# Patient Record
Sex: Male | Born: 1975 | Race: Black or African American | Hispanic: No | Marital: Single | Smoking: Never smoker
Health system: Southern US, Community
[De-identification: ages and names within clinical notes are randomized; demographics above are authoritative.]

## PROBLEM LIST (undated history)

## (undated) HISTORY — PX: OTHER SURGICAL HISTORY: SHX169

---

## 2007-01-31 ENCOUNTER — Emergency Department (HOSPITAL_COMMUNITY): Admission: EM | Admit: 2007-01-31 | Discharge: 2007-01-31 | Payer: Self-pay | Admitting: Emergency Medicine

## 2008-09-16 ENCOUNTER — Encounter: Admission: RE | Admit: 2008-09-16 | Discharge: 2008-09-16 | Payer: Self-pay | Admitting: Occupational Medicine

## 2011-01-25 IMAGING — CR DG CHEST 1V
1 series · 1 of 1 positions shown · non-contrast
Comparison: January 31, 2007

CLINICAL DATA: Pre employment respiratory exam

CHEST - 1 VIEW

[view not recorded]
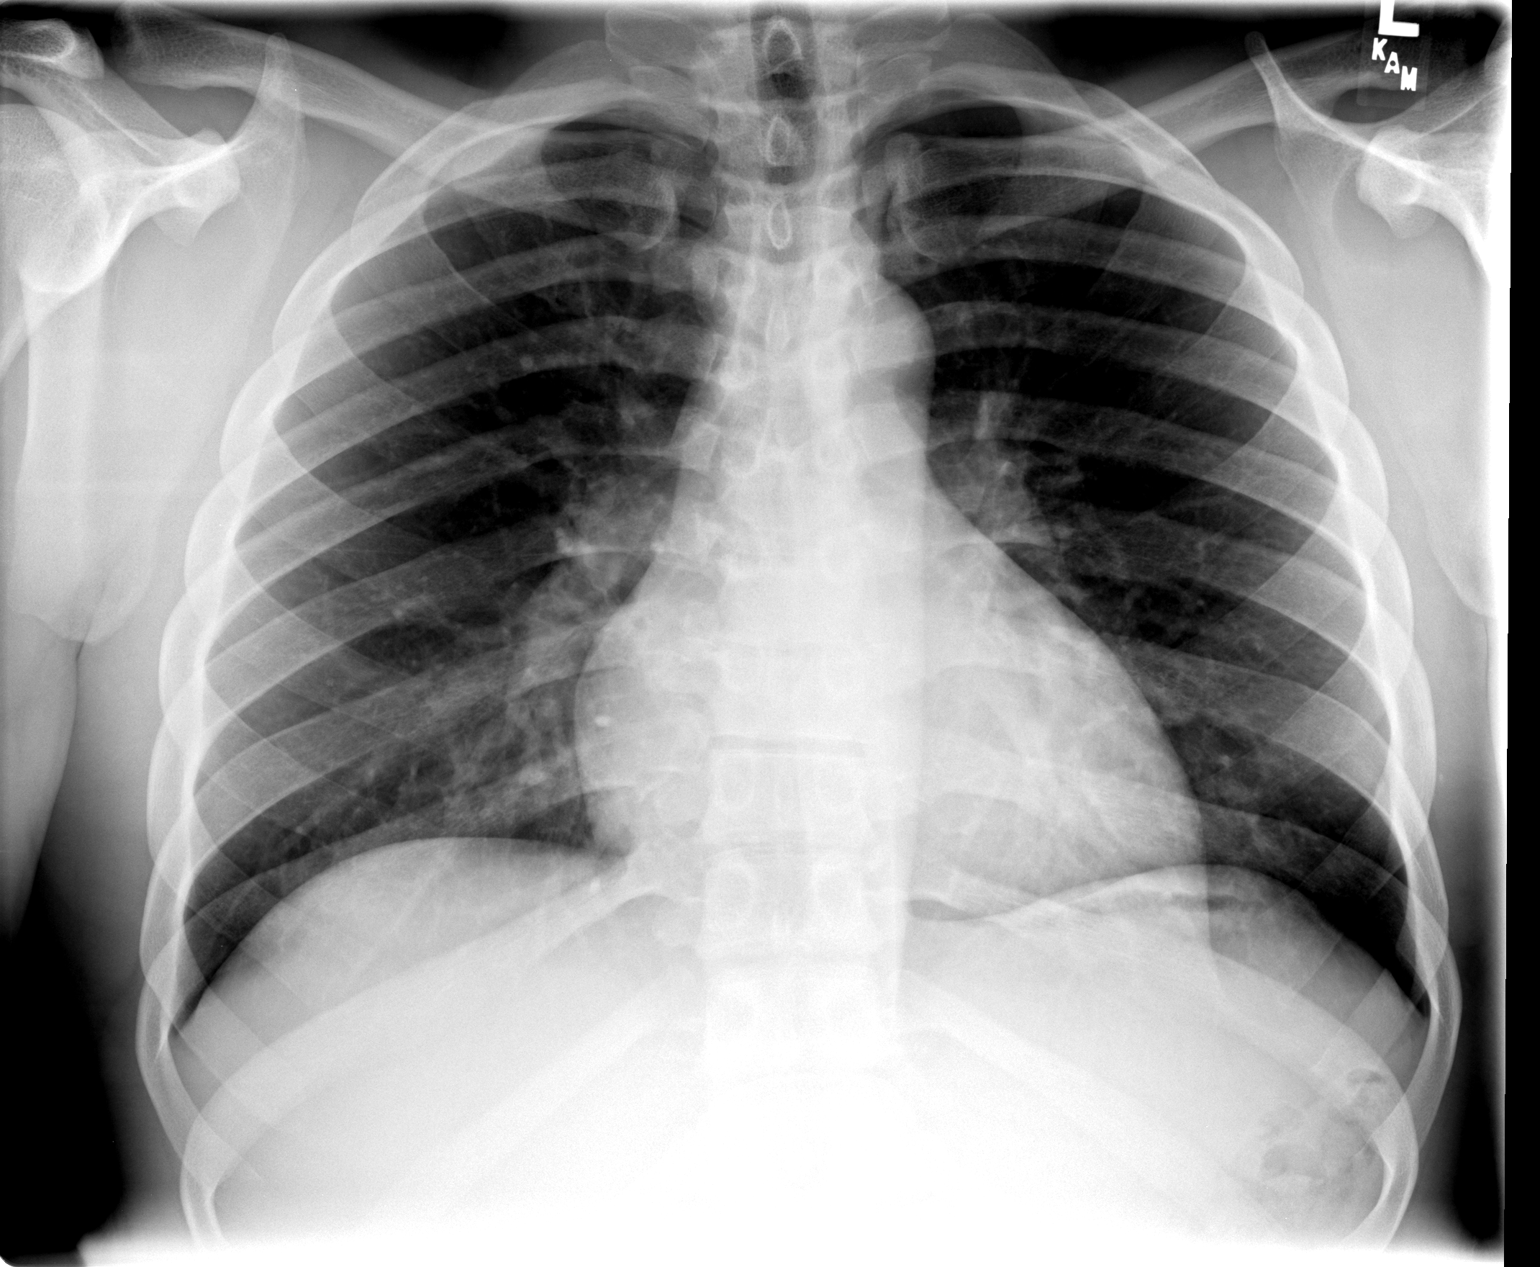

[1 of 1 positions shown; findings below may reference images not displayed]

FINDINGS: The cardiac silhouette, mediastinum, pulmonary
vasculature are within normal limits.  Both lungs are clear.  The
osseous structures are unremarkable.
IMPRESSION: Stable frontal chest x-ray, negative.

## 2011-05-10 DIAGNOSIS — B20 Human immunodeficiency virus [HIV] disease: Secondary | ICD-10-CM | POA: Insufficient documentation

## 2011-05-10 DIAGNOSIS — Z21 Asymptomatic human immunodeficiency virus [HIV] infection status: Secondary | ICD-10-CM

## 2011-05-10 HISTORY — DX: Asymptomatic human immunodeficiency virus (hiv) infection status: Z21

## 2014-11-20 DIAGNOSIS — IMO0001 Reserved for inherently not codable concepts without codable children: Secondary | ICD-10-CM | POA: Insufficient documentation

## 2015-11-30 DIAGNOSIS — Z113 Encounter for screening for infections with a predominantly sexual mode of transmission: Secondary | ICD-10-CM | POA: Insufficient documentation

## 2015-11-30 DIAGNOSIS — Z8619 Personal history of other infectious and parasitic diseases: Secondary | ICD-10-CM | POA: Insufficient documentation

## 2015-11-30 DIAGNOSIS — Z0000 Encounter for general adult medical examination without abnormal findings: Secondary | ICD-10-CM | POA: Insufficient documentation

## 2016-05-02 DIAGNOSIS — K219 Gastro-esophageal reflux disease without esophagitis: Secondary | ICD-10-CM | POA: Insufficient documentation

## 2016-05-02 DIAGNOSIS — Z79899 Other long term (current) drug therapy: Secondary | ICD-10-CM | POA: Insufficient documentation

## 2016-05-17 DIAGNOSIS — R030 Elevated blood-pressure reading, without diagnosis of hypertension: Secondary | ICD-10-CM | POA: Insufficient documentation

## 2016-08-08 ENCOUNTER — Ambulatory Visit (INDEPENDENT_AMBULATORY_CARE_PROVIDER_SITE_OTHER): Payer: Self-pay | Admitting: Podiatry

## 2016-08-08 ENCOUNTER — Ambulatory Visit (INDEPENDENT_AMBULATORY_CARE_PROVIDER_SITE_OTHER): Payer: Self-pay

## 2016-08-08 VITALS — BP 140/82

## 2016-08-08 DIAGNOSIS — M779 Enthesopathy, unspecified: Secondary | ICD-10-CM

## 2016-08-08 DIAGNOSIS — B351 Tinea unguium: Secondary | ICD-10-CM

## 2016-08-08 DIAGNOSIS — M7989 Other specified soft tissue disorders: Secondary | ICD-10-CM

## 2016-08-08 MED ORDER — TRIAMCINOLONE ACETONIDE 10 MG/ML IJ SUSP
10.0000 mg | Freq: Once | INTRAMUSCULAR | Status: AC
  Administered 2016-08-08: 10 mg

## 2016-08-08 MED ORDER — DICLOFENAC SODIUM 75 MG PO TBEC: 75.0000 mg | DELAYED_RELEASE_TABLET | Freq: Two times a day (BID) | ORAL | 2 refills | Status: DC

## 2016-08-08 NOTE — Progress Notes (Signed)
Subjective:    Patient ID: Timothy BandaGregory Holden, male   DOB: 41 y.o.   MRN: [id number]   HPI patient presents stating he's had a lot of pain on the inside of his right ankle and some on the outside and also has significant nail disease of all nails with thickness and discoloration along with skin    Review of Systems  All other systems reviewed and are negative.       Objective:  Physical Exam  Constitutional: He is oriented to person, place, and time.  Cardiovascular: Intact distal pulses.   Musculoskeletal: Normal range of motion.  Neurological: He is alert and oriented to person, place, and time.  Skin: Skin is warm.  Nursing note and vitals reviewed.  neurovascular status found to be intact muscle strength was adequate range of motion within normal limits with no indication of posterior tibial muscle dysfunction quite a bit of discomfort as it comes under the medial malleolus and slightly proximal. Lateral pain is also present but it appears to be more compensation with no other pathology noted at this time     Assessment:    Most likely posterior tibial tendinitis right with compensatory lateral pain     Plan:    H&P condition and arch explained to patient. At this point I did a careful sheath injection right 3 mg Kenalog 5 mill grams Xylocaine and I applied a fascial brace to lift the arch up and then placed on diclofenac 75 mg twice a day and will see back again in 2 weeks. I am sending him for liver function studies to make sure there is no issues before we would consider antifungal therapy  X-rays indicate mild depression of the arch but no indication of complete collapse with mild arthritis noted

## 2016-08-08 NOTE — Progress Notes (Signed)
   Subjective:    Patient ID: Timothy Holden, male    DOB: 01/22/1976, 41 y.o.   MRN: [id number]  HPI    Review of Systems  Constitutional: Positive for activity change.  Musculoskeletal: Positive for arthralgias and myalgias.  All other systems reviewed and are negative.      Objective:   Physical Exam        Assessment & Plan:

## 2016-08-16 ENCOUNTER — Ambulatory Visit: Payer: Self-pay | Admitting: Podiatry

## 2016-08-22 ENCOUNTER — Ambulatory Visit: Payer: Self-pay | Admitting: Podiatry

## 2016-08-28 ENCOUNTER — Telehealth: Payer: Self-pay | Admitting: Podiatry

## 2016-08-28 NOTE — Telephone Encounter (Signed)
I informed pt, I had reviewed his clinicals and his appt was not based on the lab Dr. Charlsie Merlesegal had ordered, but to get them performed as soon as he could and we could always call him with the results. Pt states understanding.

## 2016-08-28 NOTE — Telephone Encounter (Signed)
Patient is scheduled for a follow up appointment with Dr. Charlsie Merlesegal tomorrow morning. He was supposed to have blood work taken before the appointment but due to conflicts has not been able to go. Wants to know if he went today would it be too early for him to keep the appointment for tomorrow morning or could we still see him. Requested a call back.

## 2016-08-29 ENCOUNTER — Ambulatory Visit (INDEPENDENT_AMBULATORY_CARE_PROVIDER_SITE_OTHER): Payer: Self-pay | Admitting: Podiatry

## 2016-08-29 ENCOUNTER — Telehealth: Payer: Self-pay | Admitting: *Deleted

## 2016-08-29 ENCOUNTER — Encounter: Payer: Self-pay | Admitting: Podiatry

## 2016-08-29 DIAGNOSIS — M779 Enthesopathy, unspecified: Secondary | ICD-10-CM

## 2016-08-29 MED ORDER — TRIAMCINOLONE ACETONIDE 10 MG/ML IJ SUSP
10.0000 mg | Freq: Once | INTRAMUSCULAR | Status: AC
  Administered 2016-08-29: 10 mg

## 2016-08-29 NOTE — Progress Notes (Signed)
Subjective:    Patient ID: Margit BandaGregory Frett, male   DOB: 41 y.o.   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 Also scanned for custom orthotics

## 2016-08-29 NOTE — Telephone Encounter (Signed)
Pt states was seen in office today and had an injection in the outside of his right ankle, went to the gym and was doing stretches on the leg press, now ankle is painful and extremely swollen, what ankle brace does Dr. Charlsie Merlesegal want him to use. Dr. Charlsie Merlesegal states have pt use the brace given and to rest, ice at least 3-4 times a day for 15-20 minutes each session protecting skin from the ice pack with cloth, and if continued problem make an appt for next week. I informed pt and he states understanding.

## 2016-09-20 ENCOUNTER — Ambulatory Visit: Payer: Self-pay | Admitting: Podiatry

## 2016-09-26 ENCOUNTER — Telehealth: Payer: Self-pay | Admitting: *Deleted

## 2016-09-26 NOTE — Telephone Encounter (Signed)
Pt states Dr.Regal was going to start on a medication to treat pt's fungal infection and he was going to order labs, pt states he is at his PCP and she will draw the labs if I would tell her what medication Dr. Charlsie Merlesegal wanted ordered. I told her over the speaker phone that Dr. Charlsie Merlesegal would either order terbinafine or itraconazole. Pt's doctor states she will start pt on the terbinafine since the itraconazole will interfere with a medication pt is to take.

## 2016-10-25 ENCOUNTER — Ambulatory Visit (INDEPENDENT_AMBULATORY_CARE_PROVIDER_SITE_OTHER): Payer: Self-pay | Admitting: Orthotics

## 2016-10-25 DIAGNOSIS — M779 Enthesopathy, unspecified: Secondary | ICD-10-CM

## 2016-10-25 NOTE — Progress Notes (Signed)
Patient came in today to pick up custom made foot orthotics.  The goals were accomplished and the patient reported no dissatisfaction with said orthotics.  Patient was advised of breakin period and how to report any issues. 

## 2018-07-08 ENCOUNTER — Emergency Department (HOSPITAL_COMMUNITY)
Admission: EM | Admit: 2018-07-08 | Discharge: 2018-07-08 | Disposition: A | Discharge status: Home / Self Care | Payer: Self-pay | Attending: Emergency Medicine | Admitting: Emergency Medicine

## 2018-07-08 ENCOUNTER — Other Ambulatory Visit: Payer: Self-pay

## 2018-07-08 DIAGNOSIS — B20 Human immunodeficiency virus [HIV] disease: Secondary | ICD-10-CM | POA: Insufficient documentation

## 2018-07-08 DIAGNOSIS — M62838 Other muscle spasm: Secondary | ICD-10-CM | POA: Insufficient documentation

## 2018-07-08 DIAGNOSIS — Z79899 Other long term (current) drug therapy: Secondary | ICD-10-CM | POA: Insufficient documentation

## 2018-07-08 DIAGNOSIS — M542 Cervicalgia: Secondary | ICD-10-CM | POA: Insufficient documentation

## 2018-07-08 MED ORDER — KETOROLAC TROMETHAMINE 30 MG/ML IJ SOLN
30.0000 mg | Freq: Once | INTRAMUSCULAR | Status: AC
  Administered 2018-07-08: 07:00:00 30 mg via INTRAMUSCULAR
  Filled 2018-07-08: qty 1

## 2018-07-08 MED ORDER — DIAZEPAM 5 MG PO TABS
5.0000 mg | ORAL_TABLET | Freq: Once | ORAL | Status: AC
  Administered 2018-07-08: 07:00:00 5 mg via ORAL
  Filled 2018-07-08: qty 1

## 2018-07-08 MED ORDER — DIAZEPAM 5 MG PO TABS: 5.0000 mg | ORAL_TABLET | Freq: Two times a day (BID) | ORAL | 0 refills | Status: DC | PRN

## 2018-07-08 MED ORDER — IBUPROFEN 600 MG PO TABS: 600.0000 mg | ORAL_TABLET | Freq: Four times a day (QID) | ORAL | 0 refills | Status: DC | PRN

## 2018-07-08 NOTE — ED Triage Notes (Signed)
Pt began having progressively worsening neck pain since last Monday.  Pt states the pain is 10 of 10 today.  No neuro deficits.Pt states he has tried ice, norco, IBU, (?muscle relaxer) and tylenol.

## 2018-07-08 NOTE — Discharge Instructions (Signed)
You were seen today for neck and upper back pain.  This is likely related to muscle spasm.  Take medications as prescribed.  Do not drive or operate heavy machinery while taking Valium.  Continue heat or ice.

## 2018-07-08 NOTE — ED Provider Notes (Signed)
[facility] EMERGENCY DEPARTMENT Provider Note   CSN: [id number] Arrival date & time: 07/08/18  9449    History   Chief Complaint Chief Complaint  Patient presents with  . Neck Pain    HPI Timothy Holden is a 43 y.o. male.     HPI  This is a 43 year old male with a history of reflux, HIV who presents with neck pain.  Patient reports 1 week history of right-sided neck pain.  He describes as sharp and radiating downwards into his scapula.  He states that he woke up Monday morning with the pain.  It is worse with range of motion and movement.  He has tried multiple over-the-counter medications including ibuprofen, muscle relaxant, Tylenol with minimal relief.  He rates his pain at 9 out of 10.  He denies any injury.  He denies weakness, numbness, tingling of the arm.  He denies any sore throat or fevers.  No past medical history on file.  Patient Active Problem List   Diagnosis Date Noted  . Elevated BP without diagnosis of hypertension 05/17/2016  . Encounter for long-term (current) use of medications 05/02/2016  . Gastroesophageal reflux disease without esophagitis 05/02/2016  . H/O syphilis 11/30/2015  . Screen for STD (sexually transmitted disease) 11/30/2015  . Class 3 obesity in adult 11/20/2014  . HIV (human immunodeficiency virus infection) (HCC) 05/10/2011    No past surgical history on file.      Home Medications    Prior to Admission medications   Medication Sig Start Date End Date Taking? Authorizing Provider  diazepam (VALIUM) 5 MG tablet Take 1 tablet (5 mg total) by mouth every 12 (twelve) hours as needed for muscle spasms. 07/08/18   Wylan Gentzler, Mayer Masker, MD  diclofenac (VOLTAREN) 75 MG EC tablet Take 1 tablet (75 mg total) by mouth 2 (two) times daily. 08/08/16   Lenn Sink, DPM  elvitegravir-cobicistat-emtricitabine-tenofovir (GENVOYA) 150-150-200-10 MG TABS tablet Take by mouth. 05/02/16   [provider]  ibuprofen (ADVIL)  600 MG tablet Take 1 tablet (600 mg total) by mouth every 6 (six) hours as needed. 07/08/18   Velvia Mehrer, Mayer Masker, MD  omeprazole (PRILOSEC) 40 MG capsule TK ONE C PO QAM 07/04/16   [provider]    Family History No family history on file.  Social History Social History   Tobacco Use  . Smoking status: Never Smoker  . Smokeless tobacco: Never Used  Substance Use Topics  . Alcohol use: Not on file  . Drug use: Not on file     Allergies   Other   Review of Systems Review of Systems  Constitutional: Negative for fever.  HENT: Negative for sore throat.   Respiratory: Negative for shortness of breath.   Cardiovascular: Negative for chest pain.  Musculoskeletal: Positive for neck pain.  Neurological: Negative for weakness and numbness.  All other systems reviewed and are negative.    Physical Exam Updated Vital Signs BP 137/89 (BP Location: Left Arm)   Pulse 73   Temp 98.9 F (37.2 C) (Oral)   Resp 18   Ht 1.778 m (5\' 10" )   Wt 136.1 kg   SpO2 96%   BMI 43.05 kg/m   Physical Exam Vitals signs and nursing note reviewed.  Constitutional:      General: He is not in acute distress.    Appearance: He is well-developed. He is not ill-appearing.  HENT:     Head: Normocephalic and atraumatic.  Neck:  Musculoskeletal: Normal range of motion.     Comments: Normal range of motion, no noted torticollis, no midline tenderness to palpation, step-off, deformity, tenderness to palpation over the right upper trapezius, muscle spasm noted, no overlying skin changes Cardiovascular:     Rate and Rhythm: Normal rate and regular rhythm.  Pulmonary:     Effort: Pulmonary effort is normal. No respiratory distress.  Musculoskeletal:     Comments: Tenderness palpation over the right superior trap, no overlying skin changes, asymmetric swelling, normal range of motion bilateral upper extremities  Skin:    General: Skin is warm and dry.  Neurological:     Mental Status:  He is alert and oriented to person, place, and time.     Comments: 5 out of 5 grip strength, biceps, triceps, and deltoid strength bilaterally  Psychiatric:        Mood and Affect: Mood normal.      ED Treatments / Results  Labs (all labs ordered are listed, but only abnormal results are displayed) Labs Reviewed - No data to display  EKG None  Radiology No results found.  Procedures Procedures (including critical care time)  Medications Ordered in ED Medications  diazepam (VALIUM) tablet 5 mg (5 mg Oral Given 07/08/18 0702)  ketorolac (TORADOL) 30 MG/ML injection 30 mg (30 mg Intramuscular Given 07/08/18 0702)     Initial Impression / Assessment and Plan / ED Course  I have reviewed the triage vital signs and the nursing notes.  Pertinent labs & imaging results that were available during my care of the patient were reviewed by me and considered in my medical decision making (see chart for details).        Presents with pain in the right upper trapezius.  Muscle spasm noted on exam.  No overlying skin changes or fluctuance.  Suspect musculoskeletal etiology.  Patient given Valium and Toradol.  No weakness or radicular symptoms.  Final Clinical Impressions(s) / ED Diagnoses   Final diagnoses:  Muscle spasm  Neck pain    ED Discharge Orders         Ordered    diazepam (VALIUM) 5 MG tablet  Every 12 hours PRN     07/08/18 0719    ibuprofen (ADVIL) 600 MG tablet  Every 6 hours PRN     07/08/18 0719           Shon BatonHorton, Melisha Eggleton F, MD 07/08/18 [phone]

## 2018-07-08 NOTE — ED Notes (Signed)
Patient verbalizes understanding of discharge instructions. Opportunity for questioning and answers were provided. Armband removed by staff, pt discharged from ED.  

## 2018-07-08 NOTE — ED Provider Notes (Signed)
Blood pressure 137/89, pulse 73, temperature 98.9 F (37.2 C), temperature source Oral, resp. rate 18, height 5\' 10"  (1.778 m), weight 136.1 kg, SpO2 96 %.  Assuming care from Dr. Wilkie Aye.  In short, Timothy Holden is a 43 y.o. male with a chief complaint of Neck Pain .  Refer to the original H&P for additional details.  08:00 AM  Patient feeling better after Toradol and Valium.  He is calling for a ride home.  Discussed side effects of Valium including drowsiness and that he should not drive a vehicle. Patient ready for discharge.     Maia Plan, MD 07/08/18 [id number]

## 2018-09-01 ENCOUNTER — Ambulatory Visit: Payer: Self-pay | Admitting: Podiatry

## 2018-09-04 ENCOUNTER — Ambulatory Visit: Payer: Self-pay | Admitting: Podiatry

## 2018-09-08 ENCOUNTER — Encounter: Payer: Self-pay | Admitting: Podiatry

## 2018-09-08 ENCOUNTER — Ambulatory Visit (INDEPENDENT_AMBULATORY_CARE_PROVIDER_SITE_OTHER): Payer: Self-pay | Admitting: Podiatry

## 2018-09-08 ENCOUNTER — Other Ambulatory Visit: Payer: Self-pay | Admitting: Podiatry

## 2018-09-08 ENCOUNTER — Other Ambulatory Visit: Payer: Self-pay

## 2018-09-08 ENCOUNTER — Ambulatory Visit (INDEPENDENT_AMBULATORY_CARE_PROVIDER_SITE_OTHER): Payer: Self-pay

## 2018-09-08 VITALS — Temp 97.7°F

## 2018-09-08 DIAGNOSIS — M779 Enthesopathy, unspecified: Secondary | ICD-10-CM

## 2018-09-08 DIAGNOSIS — M79671 Pain in right foot: Secondary | ICD-10-CM

## 2018-09-08 DIAGNOSIS — M25571 Pain in right ankle and joints of right foot: Secondary | ICD-10-CM

## 2018-09-08 DIAGNOSIS — M25572 Pain in left ankle and joints of left foot: Secondary | ICD-10-CM

## 2018-09-08 MED ORDER — DICLOFENAC SODIUM 75 MG PO TBEC
75.0000 mg | DELAYED_RELEASE_TABLET | Freq: Two times a day (BID) | ORAL | 2 refills | Status: DC
Start: 2018-09-08 — End: 2023-10-09

## 2018-09-08 NOTE — Patient Instructions (Signed)

## 2018-09-09 NOTE — Progress Notes (Signed)
Subjective:   Patient ID: Timothy Holden, male   DOB: 43 y.o.   MRN: [id number]   HPI Patient states he has a lot of pain on the inside of the right ankle and that the areas we worked on before seem to be improved and he feels like his orthotics are not giving him enough support   ROS      Objective:  Physical Exam  Neurovascular status intact with patient found to have adequate range of motion with no indication of arthritis subtalar joint with moderate depression of the arch and quite a bit of discomfort in the proximal posterior tibial tendon as it comes along the side of the medial malleolus.  No indication of muscle strength loss and this appears to be where the pain is most intense currently     Assessment:  Posterior tibial tendinitis right with also some ankle issues secondary to foot structure     Plan:  H&P condition reviewed discussed continued orthotic usage and at this point I did do a careful injection into the sheath of the posterior tibial tendon 3 mg Kenalog 5 mg Xylocaine.  Patient will be seen back for [place] to recheck may require other treatments and may require some form of orthotic modification to try to help him  X-rays were negative for signs of fracture with moderate depression of the arch noted and no current indications of significant subtalar joint arthritis

## 2018-09-29 ENCOUNTER — Ambulatory Visit: Payer: Self-pay | Admitting: Podiatry

## 2018-10-06 ENCOUNTER — Encounter

## 2018-10-06 ENCOUNTER — Ambulatory Visit: Payer: Self-pay | Admitting: Podiatry

## 2018-10-10 ENCOUNTER — Ambulatory Visit: Payer: Self-pay | Admitting: Podiatry

## 2018-10-23 ENCOUNTER — Encounter: Payer: Self-pay | Admitting: Podiatry

## 2018-11-06 ENCOUNTER — Ambulatory Visit: Payer: Self-pay | Admitting: Podiatry

## 2022-08-03 DIAGNOSIS — M19012 Primary osteoarthritis, left shoulder: Secondary | ICD-10-CM | POA: Diagnosis not present

## 2022-08-03 DIAGNOSIS — M25512 Pain in left shoulder: Secondary | ICD-10-CM | POA: Diagnosis not present

## 2022-08-03 DIAGNOSIS — M25562 Pain in left knee: Secondary | ICD-10-CM | POA: Diagnosis not present

## 2022-08-09 ENCOUNTER — Encounter: Payer: Self-pay | Admitting: Nurse Practitioner

## 2022-08-09 ENCOUNTER — Ambulatory Visit: Payer: Medicaid Other | Admitting: Nurse Practitioner

## 2022-08-09 VITALS — BP 112/78 | HR 75 | Temp 99.6°F | Resp 16 | Ht 70.0 in | Wt 315.0 lb

## 2022-08-09 DIAGNOSIS — E7800 Pure hypercholesterolemia, unspecified: Secondary | ICD-10-CM

## 2022-08-09 DIAGNOSIS — B20 Human immunodeficiency virus [HIV] disease: Secondary | ICD-10-CM

## 2022-08-09 DIAGNOSIS — M25512 Pain in left shoulder: Secondary | ICD-10-CM | POA: Insufficient documentation

## 2022-08-09 DIAGNOSIS — R7303 Prediabetes: Secondary | ICD-10-CM | POA: Diagnosis not present

## 2022-08-09 DIAGNOSIS — M25511 Pain in right shoulder: Secondary | ICD-10-CM | POA: Insufficient documentation

## 2022-08-09 MED ORDER — WEGOVY 0.25 MG/0.5ML ~~LOC~~ SOAJ
0.2500 mg | SUBCUTANEOUS | 0 refills | Status: DC
Start: 2022-08-09 — End: 2023-05-10

## 2022-08-09 NOTE — Patient Instructions (Signed)
Nice to see you today Keep your appointment with me as scheudled

## 2022-08-09 NOTE — Assessment & Plan Note (Signed)
Recently seen by orthopedist and has been injection done.  Patient can use topical analgesics as needed anti-inflammatories by mouth and ice along with rest over the next week and shoulder discomfort.  Continue following up with orthopedist as recommended

## 2022-08-09 NOTE — Assessment & Plan Note (Signed)
Last A1c 5.9%.  Referral to nutrition start [facility] for help with weight management

## 2022-08-09 NOTE — Progress Notes (Signed)
Acute Office Visit  Subjective:     Patient ID: Timothy Holden, male    DOB: [birth year], 47 y.o.   MRN: [id number]  Chief Complaint  Patient presents with   Shoulder Pain    Left shoulder Pain Has MRI scheduled for June   Weight Gain     Patient is in today for shoulder pain   Shoulder: left sided and patient has been evaluated for the same and has a MRI scheduled per his report. State that he was seen by ortho and  was given an injection and did well but having a flare up. States   Weight gain: has a history of pre diabetes and HLD. States that since he is having trouble with his knee and shoulder. States that he is less excited to go to the gym. States that he is in the house and eating more. States that he has knee pain and plantar fa  2 meals a day and snacks throught the day States that he does drink water and occ fruit jucie. States a cup of coffee in States that he go to the gym 4 times a week prior to the shoulder and the knee. States that it has slowed down some. States over the past 2 months he has been 4 times to the gym  Review of Systems  Constitutional:  Negative for chills and fever.  Respiratory:  Negative for shortness of breath.   Cardiovascular:  Negative for chest pain.  Gastrointestinal:  Negative for abdominal pain, blood in stool, constipation, diarrhea, melena, nausea and vomiting.       BM  Genitourinary:  Negative for dysuria and hematuria.  Neurological:  Negative for headaches.        Objective:    BP 112/78   Pulse 75   Temp 99.6 F (37.6 C)   Resp 16   Ht 5\' 10"  (1.778 m)   Wt (!) 315 lb (142.9 kg)   SpO2 96%   BMI 45.20 kg/m    Physical Exam Vitals and nursing note reviewed.  Constitutional:      Appearance: Normal appearance.  Cardiovascular:     Rate and Rhythm: Normal rate and regular rhythm.     Heart sounds: Normal heart sounds.  Pulmonary:     Effort: Pulmonary effort is normal.     Breath sounds: Normal breath sounds.   Musculoskeletal:        General: Tenderness present. No signs of injury.       Arms:     Comments: Negative empty can test  Positive Leanord Asal test  Neurological:     Mental Status: He is alert.     No results found for any visits on 08/09/22.      Assessment & Plan:   Problem List Items Addressed This Visit       Other   HIV (human immunodeficiency virus infection) (HCC)    Patient currently on Genvoya and followed by infectious disease clinic in [place]      Morbid obesity [facility])    Patient has comorbidities of prediabetes and hypercholesterolemia.  Patient does go to the gym but is having some orthopedic issues being followed by specialist currently referral for an nutrition consult on Wegovy      Relevant Medications   Semaglutide-Weight Management (WEGOVY) 0.25 MG/0.5ML SOAJ   Other Relevant Orders   Referral to Nutrition and Diabetes Services   Pure hypercholesterolemia    Patient's last LDL was in the 160s.  Referral to  nutritionist start Wegovy to help aid in weight management      Relevant Medications   Semaglutide-Weight Management (WEGOVY) 0.25 MG/0.5ML SOAJ   Other Relevant Orders   Referral to Nutrition and Diabetes Services   Pre-diabetes    Last A1c 5.9%.  Referral to nutrition start Wegovy for help with weight management      Relevant Medications   Semaglutide-Weight Management (WEGOVY) 0.25 MG/0.5ML SOAJ   Other Relevant Orders   Referral to Nutrition and Diabetes Services   Acute pain of left shoulder - Primary    Recently seen by orthopedist and has been injection done.  Patient can use topical analgesics as needed anti-inflammatories by mouth and ice along with rest over the next week and shoulder discomfort.  Continue following up with orthopedist as recommended       Meds ordered this encounter  Medications   Semaglutide-Weight Management (WEGOVY) 0.25 MG/0.5ML SOAJ    Sig: Inject 0.25 mg into the skin once a week.     Dispense:  2 mL    Refill:  0    Order Specific Question:   Supervising Provider    Answer:   Roxy Manns A [[id number]]    Return if symptoms worsen or fail to improve, for As scheduled .  Audria Nine, NP

## 2022-08-09 NOTE — Assessment & Plan Note (Signed)
Patient's last LDL was in the 160s.  Referral to nutritionist start Wegovy to help aid in weight management

## 2022-08-09 NOTE — Assessment & Plan Note (Signed)
Patient currently on Genvoya and followed by infectious disease clinic in [place]

## 2022-08-09 NOTE — Assessment & Plan Note (Signed)
Patient has comorbidities of prediabetes and hypercholesterolemia.  Patient does go to the gym but is having some orthopedic issues being followed by specialist currently referral for an nutrition consult on [facility]

## 2022-08-13 ENCOUNTER — Other Ambulatory Visit (HOSPITAL_COMMUNITY): Payer: Self-pay

## 2022-08-13 ENCOUNTER — Telehealth: Payer: Self-pay | Admitting: General Practice

## 2022-08-13 DIAGNOSIS — M25462 Effusion, left knee: Secondary | ICD-10-CM | POA: Diagnosis not present

## 2022-08-13 DIAGNOSIS — M25562 Pain in left knee: Secondary | ICD-10-CM | POA: Diagnosis not present

## 2022-08-13 NOTE — Telephone Encounter (Signed)
Pt has medicaid through [employer] and medicaid doesn't cover injectable weight loss drugs

## 2022-08-13 NOTE — Telephone Encounter (Signed)
Patient contacted the office regarding prior auth on wegovy. States he has contacted [employer] and was told nothing had been sent in yet. Requesting prior auth be sent in for this medication. Please advise, thank you.

## 2022-08-13 NOTE — Telephone Encounter (Signed)
My understanding is that they are not covered. They could have told him about ozempic or mounjaro as they will use the chemical name and he would have to have diabetes for those to be covered

## 2022-08-13 NOTE — Telephone Encounter (Signed)
Called pt and he stated that he called the insurance and they told him if he other conditions that they would cover it.

## 2022-08-17 NOTE — Telephone Encounter (Signed)
Left message to return call to our office.  

## 2022-08-22 NOTE — Telephone Encounter (Signed)
Left message to return call to our office.  

## 2022-10-03 ENCOUNTER — Telehealth: Payer: Self-pay

## 2022-10-03 ENCOUNTER — Ambulatory Visit: Payer: Medicaid Other | Admitting: Nurse Practitioner

## 2022-10-03 NOTE — Telephone Encounter (Signed)
Patient was scheduled for acute visit today with [facility]. He has not established with [facility] yet. We have requested that patient not be scheduled any more acute visits until established.

## 2022-10-10 NOTE — Telephone Encounter (Signed)
Modifier added for acute and office visits to not allow scheduling...will remove modifier after the patient shows up for their new patient appointment

## 2022-10-11 NOTE — Telephone Encounter (Signed)
Looks like the only appointment is the 10/26/2022 which is the establish care visit

## 2022-10-25 ENCOUNTER — Ambulatory Visit: Payer: Medicaid Other | Admitting: Dietician

## 2022-10-26 ENCOUNTER — Ambulatory Visit: Payer: Medicaid Other | Admitting: Nurse Practitioner

## 2022-10-29 ENCOUNTER — Encounter: Payer: Self-pay | Admitting: General Practice

## 2022-11-02 DIAGNOSIS — K219 Gastro-esophageal reflux disease without esophagitis: Secondary | ICD-10-CM | POA: Diagnosis not present

## 2022-11-02 DIAGNOSIS — R7303 Prediabetes: Secondary | ICD-10-CM | POA: Diagnosis not present

## 2022-11-02 DIAGNOSIS — B20 Human immunodeficiency virus [HIV] disease: Secondary | ICD-10-CM | POA: Diagnosis not present

## 2022-11-02 DIAGNOSIS — Z79899 Other long term (current) drug therapy: Secondary | ICD-10-CM | POA: Diagnosis not present

## 2022-11-02 DIAGNOSIS — E7800 Pure hypercholesterolemia, unspecified: Secondary | ICD-10-CM | POA: Diagnosis not present

## 2023-01-22 DIAGNOSIS — Z113 Encounter for screening for infections with a predominantly sexual mode of transmission: Secondary | ICD-10-CM | POA: Diagnosis not present

## 2023-01-22 DIAGNOSIS — Z7251 High risk heterosexual behavior: Secondary | ICD-10-CM | POA: Diagnosis not present

## 2023-03-18 DIAGNOSIS — Z23 Encounter for immunization: Secondary | ICD-10-CM | POA: Diagnosis not present

## 2023-03-18 DIAGNOSIS — Z79899 Other long term (current) drug therapy: Secondary | ICD-10-CM | POA: Diagnosis not present

## 2023-03-18 DIAGNOSIS — Z8619 Personal history of other infectious and parasitic diseases: Secondary | ICD-10-CM | POA: Diagnosis not present

## 2023-03-18 DIAGNOSIS — R7303 Prediabetes: Secondary | ICD-10-CM | POA: Diagnosis not present

## 2023-03-18 DIAGNOSIS — E7800 Pure hypercholesterolemia, unspecified: Secondary | ICD-10-CM | POA: Diagnosis not present

## 2023-03-18 DIAGNOSIS — B20 Human immunodeficiency virus [HIV] disease: Secondary | ICD-10-CM | POA: Diagnosis not present

## 2023-05-02 ENCOUNTER — Other Ambulatory Visit: Payer: Self-pay | Admitting: Nurse Practitioner

## 2023-05-02 DIAGNOSIS — R7303 Prediabetes: Secondary | ICD-10-CM

## 2023-05-02 DIAGNOSIS — E7800 Pure hypercholesterolemia, unspecified: Secondary | ICD-10-CM

## 2023-05-02 NOTE — Telephone Encounter (Signed)
Copied from CRM [id number]. Topic: Clinical - Medication Refill >> May 02, 2023  2:07 PM Orinda Kenner C wrote: Most Recent Primary Care Visit:  Provider: Eden Emms  Department: [facility]  Visit Type: OFFICE VISIT  Date: 08/09/2022  Medication: Semaglutide-Weight Management (WEGOVY) 0.25 MG/0.5ML SOAJ  Has the patient contacted their pharmacy? No (Agent: If no, request that the patient contact the pharmacy for the refill. If patient does not wish to contact the pharmacy document the reason why and proceed with request.) (Agent: If yes, when and what did the pharmacy advise?)  Is this the correct pharmacy for this prescription? Yes If no, delete pharmacy and type the correct one.  This is the patient's preferred pharmacy:  [facility] [phone] [facility] [place] - [address] [place] [address] Phone: [phone] Fax: [phone]   Has the prescription been filled recently? No  Is the patient out of the medication? Yes  Has the patient been seen for an appointment in the last year OR does the patient have an upcoming appointment? No. Patient will not provide information, states it's private and wants to speak with the nurse. Informed patient last seen was 08/09/22 with NP, Mordecai Maes and appointment may be needed. Patient declined.    Can we respond through MyChart? No, [phone]  Agent: Please be advised that Rx refills may take up to 3 business days. We ask that you follow-up with your pharmacy.

## 2023-05-10 ENCOUNTER — Ambulatory Visit: Payer: Medicaid Other | Admitting: Nurse Practitioner

## 2023-05-10 ENCOUNTER — Encounter: Payer: Self-pay | Admitting: Nurse Practitioner

## 2023-05-10 VITALS — BP 114/80 | HR 65 | Temp 98.4°F | Ht 70.0 in | Wt 287.6 lb

## 2023-05-10 DIAGNOSIS — Z21 Asymptomatic human immunodeficiency virus [HIV] infection status: Secondary | ICD-10-CM | POA: Diagnosis not present

## 2023-05-10 DIAGNOSIS — E7800 Pure hypercholesterolemia, unspecified: Secondary | ICD-10-CM

## 2023-05-10 DIAGNOSIS — Z125 Encounter for screening for malignant neoplasm of prostate: Secondary | ICD-10-CM | POA: Diagnosis not present

## 2023-05-10 DIAGNOSIS — Z1211 Encounter for screening for malignant neoplasm of colon: Secondary | ICD-10-CM | POA: Diagnosis not present

## 2023-05-10 DIAGNOSIS — Z0000 Encounter for general adult medical examination without abnormal findings: Secondary | ICD-10-CM | POA: Diagnosis not present

## 2023-05-10 DIAGNOSIS — K219 Gastro-esophageal reflux disease without esophagitis: Secondary | ICD-10-CM

## 2023-05-10 DIAGNOSIS — R7303 Prediabetes: Secondary | ICD-10-CM | POA: Diagnosis not present

## 2023-05-10 MED ORDER — WEGOVY 2.4 MG/0.75ML ~~LOC~~ SOAJ: 2.4000 mg | SUBCUTANEOUS | 0 refills | Status: AC

## 2023-05-10 NOTE — Patient Instructions (Signed)
 Nice to see you today I have sent in wegovy to the pharmacy  Follow up with me in 3 months, sooner if you need me

## 2023-05-10 NOTE — Assessment & Plan Note (Signed)
 Discussed age-appropriate immunization screening exams.  Reviewed patient's personal, surgical, social, family histories.  Patient is up-to-date on all age-appropriate vaccinations he would like.  Ambulatory referral placed to GI for CRC screening.  PSA placed today for prostate cancer screening.  Patient was given information at discharge about preventative healthcare maintenance with anticipatory guidance.

## 2023-05-10 NOTE — Assessment & Plan Note (Signed)
 Patient currently followed by infectious disease and maintained on Genvoya.  Continue take medication as prescribed follow-up with mixed diseases recommended

## 2023-05-10 NOTE — Progress Notes (Signed)
 Established Patient Office Visit  Subjective   Patient ID: Timothy Holden, male    DOB: Jul 25, 1975  Age: 48 y.o. MRN: [id number]  Chief Complaint  Patient presents with   Medication Management    Pt complains that he is now taking 2.4 we govy that is being pescribed by Dr. Roxan Hockey in [place].  States he has been on this dose for 2 months. Pt states that Dr. Roxan Hockey requested him to see PCP to discuss [facility]      HPI  HIV: patient is followed in ID and on genvoya recent visit with labs on 03/18/2023  HLD: History of the same.  Patient's last lipid panel was 05/30/2022.  With an LDL of 165  Prediabetes: patient is currenlty on wegovy 2.4mg  weekly. This was started with previous ID provider.  They requested that PCP take over will get management.  Patient is tolerating medication well he has had appreciable weight loss with the medication.  Last A1c was drawn on 03/18/2023 with a result of 5.6  for complete physical and follow up of chronic conditions.  Immunizations: -Tetanus: Completed in 03/18/2023 -Influenza: 03/18/2023 -Shingles: too young  -Pneumonia: Completed   Diet: Fair diet. Smaller meals through thte day. States that he does not do breakfast 2-3 meals a day. He will snack when he is on the road for longer than 30 mins.  Exercise: Goes to the gym 3 plus a week with 30-60 mins. Mainly weight training.   Eye exam: wears glasses needs updating  Dental exam: Completes semi-annually    Colonoscopy: Needs ambulatory referral to GI in Campus  Lung Cancer Screening: N/A  PSA: Due        Review of Systems  Constitutional:  Negative for chills and fever.  Respiratory:  Negative for shortness of breath.   Cardiovascular:  Negative for chest pain and leg swelling.  Gastrointestinal:  Negative for abdominal pain, blood in stool, constipation, diarrhea, nausea and vomiting.       BM daily   Genitourinary:  Negative for dysuria and hematuria.  Neurological:   Negative for tingling and headaches.  Psychiatric/Behavioral:  Negative for hallucinations and suicidal ideas.       Objective:     BP 114/80   Pulse 65   Temp 98.4 F (36.9 C) (Oral)   Ht 5\' 10"  (1.778 m)   Wt 287 lb 9.6 oz (130.5 kg)   SpO2 96%   BMI 41.27 kg/m  BP Readings from Last 3 Encounters:  05/10/23 114/80  08/09/22 112/78  07/08/18 128/76   Wt Readings from Last 3 Encounters:  05/10/23 287 lb 9.6 oz (130.5 kg)  08/09/22 (!) 315 lb (142.9 kg)  07/08/18 300 lb (136.1 kg)   SpO2 Readings from Last 3 Encounters:  05/10/23 96%  08/09/22 96%  07/08/18 93%      Physical Exam Vitals and nursing note reviewed.  Constitutional:      Appearance: Normal appearance.  HENT:     Right Ear: Tympanic membrane, ear canal and external ear normal.     Left Ear: Tympanic membrane, ear canal and external ear normal.     Mouth/Throat:     Mouth: Mucous membranes are moist.     Pharynx: Oropharynx is clear.  Eyes:     Extraocular Movements: Extraocular movements intact.     Pupils: Pupils are equal, round, and reactive to light.  Cardiovascular:     Rate and Rhythm: Normal rate and regular rhythm.     Pulses: Normal  pulses.     Heart sounds: Normal heart sounds.  Pulmonary:     Effort: Pulmonary effort is normal.     Breath sounds: Normal breath sounds.  Abdominal:     General: Bowel sounds are normal. There is no distension.     Palpations: There is no mass.     Tenderness: There is no abdominal tenderness.     Hernia: No hernia is present.  Musculoskeletal:     Right lower leg: No edema.     Left lower leg: No edema.  Lymphadenopathy:     Cervical: No cervical adenopathy.  Skin:    General: Skin is warm.  Neurological:     General: No focal deficit present.     Mental Status: He is alert.     Deep Tendon Reflexes:     Reflex Scores:      Bicep reflexes are 2+ on the right side and 2+ on the left side.      Patellar reflexes are 2+ on the right side and 2+  on the left side.    Comments: Bilateral upper and lower extremity strength 5/5  Psychiatric:        Mood and Affect: Mood normal.        Behavior: Behavior normal.        Thought Content: Thought content normal.        Judgment: Judgment normal.      No results found for any visits on 05/10/23.    The ASCVD Risk score (Arnett DK, et al., 2019) failed to calculate for the following reasons:   Cannot find a previous total cholesterol lab    Assessment & Plan:   Problem List Items Addressed This Visit       Digestive   Gastroesophageal reflux disease without esophagitis   History of same.  No worse with initiation of GLP-1 receptor agonist.  Patient currently maintained on omeprazole 40 mg daily continue medication as prescribed        Other   HIV (human immunodeficiency virus infection) (HCC)   Patient currently followed by infectious disease and maintained on Genvoya.  Continue take medication as prescribed follow-up with mixed diseases recommended      Preventative health care - Primary   Discussed age-appropriate immunization screening exams.  Reviewed patient's personal, surgical, social, family histories.  Patient is up-to-date on all age-appropriate vaccinations he would like.  Ambulatory referral placed to GI for CRC screening.  PSA placed today for prostate cancer screening.  Patient was given information at discharge about preventative healthcare maintenance with anticipatory guidance.      Morbid obesity (HCC)   Patient is leading healthy lifestyle.  He does have quite a bit of muscle mass in office which is aiding the patient's BMI.  Continue Wegovy 2.4 mg once weekly.      Relevant Medications   Semaglutide-Weight Management (WEGOVY) 2.4 MG/0.75ML SOAJ   Pure hypercholesterolemia   History of same.  Pending lipid panel.  Continue work on healthy lifestyle-patient to continue Wegovy 2.4 mg once weekly      Relevant Orders   Lipid panel   Pre-diabetes    History of same.  Last A1c 5.6%.  Continue working hard he also modification continue taking Wegovy 2.4 mg daily      Other Visit Diagnoses       Screening for prostate cancer       Relevant Orders   PSA     Screening for colon cancer  Relevant Orders   Ambulatory referral to Gastroenterology       Return in about 3 months (around 08/07/2023) for wegovy/weight .    Audria Nine, NP

## 2023-05-10 NOTE — Assessment & Plan Note (Signed)
 History of same.  No worse with initiation of GLP-1 receptor agonist.  Patient currently maintained on omeprazole 40 mg daily continue medication as prescribed

## 2023-05-10 NOTE — Assessment & Plan Note (Signed)
 Patient is leading healthy lifestyle.  He does have quite a bit of muscle mass in office which is aiding the patient's BMI.  Continue Wegovy 2.4 mg once weekly.

## 2023-05-10 NOTE — Assessment & Plan Note (Signed)
 History of same.  Last A1c 5.6%.  Continue working hard he also modification continue taking Wegovy 2.4 mg daily

## 2023-05-10 NOTE — Assessment & Plan Note (Signed)
 History of same.  Pending lipid panel.  Continue work on healthy lifestyle-patient to continue Wegovy 2.4 mg once weekly

## 2023-05-13 ENCOUNTER — Telehealth: Payer: Self-pay

## 2023-05-13 ENCOUNTER — Other Ambulatory Visit (HOSPITAL_COMMUNITY): Payer: Self-pay

## 2023-05-13 NOTE — Telephone Encounter (Signed)
 Contacted pt. Relayed information to pt. Pt stated that his insurance will be updated by the end of the week (says Friday or Saturday).

## 2023-05-13 NOTE — Telephone Encounter (Signed)
 Unable to do the prior auth for Wegovy 2.4MG /0.75ML auto-injectors due to the fact that the insurance card that was scanned in to the media tab on 05/10/23 has been terminated. We will need updated insurance info to proceed with the prior auth.

## 2023-05-15 DIAGNOSIS — Z113 Encounter for screening for infections with a predominantly sexual mode of transmission: Secondary | ICD-10-CM | POA: Diagnosis not present

## 2023-05-15 DIAGNOSIS — Z79899 Other long term (current) drug therapy: Secondary | ICD-10-CM | POA: Diagnosis not present

## 2023-05-15 DIAGNOSIS — B20 Human immunodeficiency virus [HIV] disease: Secondary | ICD-10-CM | POA: Diagnosis not present

## 2023-05-15 DIAGNOSIS — Z13220 Encounter for screening for lipoid disorders: Secondary | ICD-10-CM | POA: Diagnosis not present

## 2023-05-16 ENCOUNTER — Encounter: Payer: Self-pay | Admitting: Nurse Practitioner

## 2023-05-16 LAB — PSA: Prostate Specific Ag, Serum: 0.6 ng/mL (ref 0.0–4.0)

## 2023-05-16 LAB — LIPID PANEL
Chol/HDL Ratio: 4.6 ratio (ref 0.0–5.0)
Cholesterol, Total: 215 mg/dL — ABNORMAL HIGH (ref 100–199)
HDL: 47 mg/dL (ref >39)
LDL Chol Calc (NIH): 146 mg/dL — ABNORMAL HIGH (ref 0–99)
Triglycerides: 121 mg/dL (ref 0–149)
VLDL Cholesterol Cal: 22 mg/dL (ref 5–40)

## 2023-05-20 ENCOUNTER — Encounter: Payer: Self-pay | Admitting: *Deleted

## 2023-05-22 ENCOUNTER — Other Ambulatory Visit: Payer: Self-pay | Admitting: Nurse Practitioner

## 2023-05-22 NOTE — Telephone Encounter (Signed)
 Copied from CRM [id number]. Topic: Clinical - Medication Refill >> May 22, 2023 10:29 AM Truddie Crumble wrote: Most Recent Primary Care Visit:  Provider: Eden Emms  Department: [facility]  Visit Type: OFFICE VISIT  Date: 05/10/2023  Medication: Semaglutide-Weight Management (WEGOVY) 2.4 MG/0.75ML SOAJ   Has the patient contacted their pharmacy? Yes (Agent: If no, request that the patient contact the pharmacy for the refill. If patient does not wish to contact the pharmacy document the reason why and proceed with request.) (Agent: If yes, when and what did the pharmacy advise?)  Is this the correct pharmacy for this prescription? Yes If no, delete pharmacy and type the correct one.  This is the patient's preferred pharmacy:  [phone],  - [address] [place] [address] Phone: [phone] Fax: [phone]   Has the prescription been filled recently? No  Is the patient out of the medication? Yes  Has the patient been seen for an appointment in the last year OR does the patient have an upcoming appointment? Yes  Can we respond through MyChart? No  Agent: Please be advised that Rx refills may take up to 3 business days. We ask that you follow-up with your pharmacy.

## 2023-05-22 NOTE — Addendum Note (Signed)
 Addended by: Melina Copa on: 05/22/2023 04:20 PM   Modules accepted: Orders

## 2023-05-22 NOTE — Telephone Encounter (Signed)
 LAST APPOINTMENT DATE: 05/22/2023  NEXT APPOINTMENT DATE: Visit date not found  WEGOVY   LAST REFILL:05/10/23   QTY: 9 mL

## 2023-05-22 NOTE — Telephone Encounter (Signed)
 Per chart review, Semiglutide-Weight Management (WEGOVY) 2.4 MG/0.75ML SOAJ was sent on 05/10/2023 and [facility] by [facility] at 9:33 AM on same date. E2C2 RN called [facility] and spoke with pharmacy tech. Medication is ready per pharmacy tech but [facility] is needing [facility] information.   Made call to patient using phone number on file-unable to leave a voice mail. Recording of "call cannot be completed as dialed." Will route to office.

## 2023-05-22 NOTE — Telephone Encounter (Signed)
 Prescription Request  05/22/2023 Patient states cannot be refilled without a PA submitted    LOV: 05/10/2023  What is the name of the medication or equipment?  Semaglutide-Weight Management (WEGOVY) 2.4 MG/0.75ML SOAJ    Have you contacted your pharmacy to request a refill? Yes   Which pharmacy would you like this sent to?  [id number], [place] - [address] [place] [address] Phone: [phone] Fax: [phone]    Patient notified that their request is being sent to the clinical staff for review and that they should receive a response within 2 business days.   Please advise at Mobile [phone] (mobile)

## 2023-05-24 ENCOUNTER — Telehealth: Payer: Self-pay | Admitting: Pharmacy Technician

## 2023-05-24 ENCOUNTER — Other Ambulatory Visit (HOSPITAL_COMMUNITY): Payer: Self-pay

## 2023-05-24 NOTE — Telephone Encounter (Signed)
 Pharmacy Patient Advocate Encounter   Received notification from CoverMyMeds that prior authorization for [facility] 2.4MG  is required/requested.   Insurance verification completed.   The patient is insured through [facility] .   Per test claim: [id number] Submitted and pending

## 2023-05-27 ENCOUNTER — Other Ambulatory Visit (HOSPITAL_COMMUNITY): Payer: Self-pay

## 2023-05-27 NOTE — Telephone Encounter (Signed)
 Pharmacy Patient Advocate Encounter  Received notification from [facility] that Prior Authorization for [facility] 2.4MG  has been APPROVED from 05/24/23 to 11/24/23. Unable to obtain price due to refill too soon rejection, last fill date 05/24/23 next available fill date05/16/25   PA #/Case ID/Reference #: [id number]

## 2023-07-04 DIAGNOSIS — E7800 Pure hypercholesterolemia, unspecified: Secondary | ICD-10-CM | POA: Diagnosis not present

## 2023-07-04 DIAGNOSIS — R7303 Prediabetes: Secondary | ICD-10-CM | POA: Diagnosis not present

## 2023-07-04 DIAGNOSIS — B20 Human immunodeficiency virus [HIV] disease: Secondary | ICD-10-CM | POA: Diagnosis not present

## 2023-07-04 DIAGNOSIS — K219 Gastro-esophageal reflux disease without esophagitis: Secondary | ICD-10-CM | POA: Diagnosis not present

## 2023-07-04 DIAGNOSIS — Z79899 Other long term (current) drug therapy: Secondary | ICD-10-CM | POA: Diagnosis not present

## 2023-07-04 DIAGNOSIS — Z760 Encounter for issue of repeat prescription: Secondary | ICD-10-CM | POA: Diagnosis not present

## 2023-08-25 ENCOUNTER — Other Ambulatory Visit: Payer: Self-pay | Admitting: Nurse Practitioner

## 2023-10-08 ENCOUNTER — Telehealth: Payer: Self-pay | Admitting: Nurse Practitioner

## 2023-10-08 NOTE — Telephone Encounter (Unsigned)
 Copied from CRM [id number]. Topic: Clinical - Medication Refill >> Oct 08, 2023  3:00 PM Savanna F wrote: Medication:  Semaglutide -Weight Management (WEGOVY ) 2.4 MG (would like another 35-month supply if possible)  Has the patient contacted their pharmacy? Yes (Agent: If no, request that the patient contact the pharmacy for the refill. If patient does not wish to contact the pharmacy document the reason why and proceed with request.) (Agent: If yes, when and what did the pharmacy advise?)  This is the patient's preferred pharmacy:  [phone], [place] - [address] [place] [address] Phone: [phone] Fax: [phone]  Is this the correct pharmacy for this prescription? Yes If no, delete pharmacy and type the correct one.   Has the prescription been filled recently? No  Is the patient out of the medication? No, has 1 week left  Has the patient been seen for an appointment in the last year OR does the patient have an upcoming appointment? Yes  Can we respond through MyChart? Yes  Agent: Please be advised that Rx refills may take up to 3 business days. We ask that you follow-up with your pharmacy.

## 2023-10-09 ENCOUNTER — Ambulatory Visit: Admitting: Family Medicine

## 2023-10-09 ENCOUNTER — Ambulatory Visit
Admission: RE | Admit: 2023-10-09 | Discharge: 2023-10-09 | Disposition: A | Discharge status: Home / Self Care | Source: Ambulatory Visit | Attending: Family Medicine | Admitting: Family Medicine

## 2023-10-09 ENCOUNTER — Ambulatory Visit: Payer: Self-pay | Admitting: Family Medicine

## 2023-10-09 ENCOUNTER — Encounter: Payer: Self-pay | Admitting: Family Medicine

## 2023-10-09 VITALS — BP 122/84 | HR 59 | Temp 98.4°F | Ht 70.0 in | Wt 275.5 lb

## 2023-10-09 DIAGNOSIS — K219 Gastro-esophageal reflux disease without esophagitis: Secondary | ICD-10-CM

## 2023-10-09 DIAGNOSIS — M25512 Pain in left shoulder: Secondary | ICD-10-CM

## 2023-10-09 DIAGNOSIS — M25511 Pain in right shoulder: Secondary | ICD-10-CM

## 2023-10-09 DIAGNOSIS — M7061 Trochanteric bursitis, right hip: Secondary | ICD-10-CM | POA: Diagnosis not present

## 2023-10-09 DIAGNOSIS — M19011 Primary osteoarthritis, right shoulder: Secondary | ICD-10-CM | POA: Diagnosis not present

## 2023-10-09 DIAGNOSIS — G8929 Other chronic pain: Secondary | ICD-10-CM | POA: Diagnosis not present

## 2023-10-09 MED ORDER — PREDNISONE 10 MG PO TABS: ORAL_TABLET | ORAL | 0 refills | Status: AC

## 2023-10-09 NOTE — Assessment & Plan Note (Signed)
 New pain with greater trochanteric tenderness  Given handout on condition and rehab Prednisone  taper prescription  Recommend diclofenac  gel and ice   Plan visit with sport med

## 2023-10-09 NOTE — Progress Notes (Signed)
 Subjective:    Patient ID: Timothy Holden, male    DOB: Apr 26, 1975, 48 y.o.   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  Left hip pain  Shoulder pain  GERD  Hips bother him  On the sides   2-3 weeks  Tries to do squats to loosen up  Also IT band stretches  Goes to the gym- focus on stretching recently  Back bothers him- tight in the low back -both sides   Has plantar fascitis  This affects gait  Working on weight loss and eating habits  Does walk a lot    GERD Omeprazole 40 mg daily (has not had for 2-3 weeks because he was doing better) On semaglutide  2.4 mg weekly  Tight on right side of his chest  Not exertional  At one point was tender to the touch  Improved today  Constant  May be related to shoulder pain     Shoulder history  Wrestled for years  Originally on right  Now both but still worse on the right  Has had shoulder issues chronically  Overuse injury  [place]for [place] of scar tissue  Never dx with rotator cuff tear   Has not seen ortho here  Is starting to get worse   Worsened by working out  Worse if he lies on that side  Worse to abduct past 90 deg  Also worse with external rotation  Some crunching     Ibuprofen  on med list -not taking  Advil  more lately 4 pills   Shoulder film DG Shoulder Right Result Date: 10/09/2023 CLINICAL DATA:  Acute on chronic right shoulder pain. Prior right shoulder surgery. EXAM: RIGHT SHOULDER - 2+ VIEW COMPARISON:  None Available. FINDINGS: Mild degenerative changes of the [facility] joint and glenohumeral joints. No acute fracture or dislocation. Remainder of the exam is unremarkable. IMPRESSION: 1. No acute  findings. 2. Mild degenerative changes. Electronically Signed   By: Toribio Agreste M.D.   On: 10/09/2023 14:42      Patient Active Problem List   Diagnosis Date Noted   Greater trochanteric bursitis of right hip 10/09/2023   Pure hypercholesterolemia 08/09/2022   Pre-diabetes 08/09/2022   Acute pain of right shoulder 08/09/2022   Elevated BP without diagnosis of hypertension 05/17/2016   Encounter for long-term (current) use of medications 05/02/2016   Gastroesophageal reflux disease without esophagitis 05/02/2016   H/O syphilis 11/30/2015   Preventative health care 11/30/2015   Morbid obesity (HCC) 11/20/2014   HIV (human immunodeficiency virus infection) (HCC) 05/10/2011   History reviewed. No pertinent past medical history. Past Surgical History:  Procedure Laterality Date   shoulder arhtroscopy Right    Social History   Tobacco Use   Smoking status: Never   Smokeless tobacco: Never  Substance Use Topics   Alcohol use: Yes    Comment: socailly   Drug use: Not Currently   Family History  Problem Relation Age of Onset   Diabetes Mother    Hypertension Father    Hyperlipidemia Father    Allergies  Allergen Reactions   Other  Current Outpatient Medications on File Prior to Visit  Medication Sig Dispense Refill   elvitegravir-cobicistat-emtricitabine-tenofovir (GENVOYA) 150-150-200-10 MG TABS tablet Take by mouth.     omeprazole (PRILOSEC) 40 MG capsule TK ONE C PO QAM  5   Semaglutide -Weight Management (WEGOVY ) 2.4 MG/0.75ML SOAJ Inject 2.4 mg into the skin once a week. 9 mL 0   No current facility-administered medications on file prior to visit.    Review of Systems  Constitutional:  Negative for activity change, appetite change, fatigue, fever and unexpected weight change.  HENT:  Negative for congestion, rhinorrhea, sore throat and trouble swallowing.   Eyes:  Negative for pain, redness, itching and visual disturbance.  Respiratory:  Negative for cough,  chest tightness, shortness of breath and wheezing.   Cardiovascular:  Negative for chest pain and palpitations.  Gastrointestinal:  Negative for abdominal pain, blood in stool, constipation, diarrhea and nausea.       Chest burning/discomfort   Endocrine: Negative for cold intolerance, heat intolerance, polydipsia and polyuria.  Genitourinary:  Negative for difficulty urinating, dysuria, frequency and urgency.  Musculoskeletal:  Positive for arthralgias and back pain. Negative for joint swelling and myalgias.  Skin:  Negative for pallor and rash.  Neurological:  Negative for dizziness, tremors, weakness, numbness and headaches.  Hematological:  Negative for adenopathy. Does not bruise/bleed easily.  Psychiatric/Behavioral:  Negative for decreased concentration and dysphoric mood. The patient is not nervous/anxious.        Objective:   Physical Exam Constitutional:      General: He is not in acute distress.    Appearance: He is well-developed. He is obese. He is not ill-appearing or diaphoretic.  HENT:     Head: Normocephalic and atraumatic.  Eyes:     Conjunctiva/sclera: Conjunctivae normal.     Pupils: Pupils are equal, round, and reactive to light.  Neck:     Thyroid: No thyromegaly.     Vascular: No carotid bruit or JVD.  Cardiovascular:     Rate and Rhythm: Normal rate and regular rhythm.     Heart sounds: Normal heart sounds.     No gallop.  Pulmonary:     Effort: Pulmonary effort is normal. No respiratory distress.     Breath sounds: Normal breath sounds. No stridor. No wheezing, rhonchi or rales.  Chest:     Chest wall: No tenderness.  Abdominal:     General: There is no distension or abdominal bruit.     Palpations: Abdomen is soft.     Tenderness: There is no abdominal tenderness. There is no guarding or rebound.  Musculoskeletal:     Cervical back: Normal range of motion and neck supple.     Right lower leg: No edema.     Left lower leg: No edema.     Comments:   Right Shoulder  No deformity/swelling/warmth or erythema  No crepitus  No obvious effusion  Abduction -pain after 90 degrees Hawking test -pos Neer test -pos Internal rotation -mild discomfort  External rotation -mod discomfort/limited Tenderness acromion Normal grip and hand dexterity  Right hip-normal rom  Tender greater trochanter area  No swelling or deformity   Mild SI joint tenderness on right  No obv scoliosis      Lymphadenopathy:     Cervical: No cervical adenopathy.  Skin:    General: Skin is warm and dry.     Coloration: Skin is not pale.     Findings: No rash.  Neurological:     Mental Status:  He is alert.     Motor: No weakness.     Coordination: Coordination normal.     Gait: Gait normal.     Deep Tendon Reflexes: Reflexes are normal and symmetric. Reflexes normal.  Psychiatric:        Mood and Affect: Mood normal.           Assessment & Plan:   Problem List Items Addressed This Visit       Digestive   Gastroesophageal reflux disease without esophagitis   Worsened lately -causing chest discomfort and heartburn Taking 800 mg of ibuprofen  regularly (try topical instead)  Encouraged to hold this  Start back on omeprazole if needed  Avoid triggers   Update if not starting to improve in a week or if worsening  Call back and Er precautions noted in detail today          Musculoskeletal and Integument   Greater trochanteric bursitis of right hip   New pain with greater trochanteric tenderness  Given handout on condition and rehab Prednisone  taper prescription  Recommend diclofenac  gel and ice   Plan visit with sport med         Other   Acute pain of right shoulder - Primary   Acute on chronic  History of past injuries with wrestling, and past surgery  Exam consistent with rotator cuff etiology  Will try prednisone  taper for discomfort  Xray ordered=some mild deg changes  Encouraged use of ice and voltaren  gel (stop oral nsaid due to  worsening gerd ) Also passive rom to prevent frozen shoulder  Plan appointment with sport med

## 2023-10-09 NOTE — Assessment & Plan Note (Signed)
 Worsened lately -causing chest discomfort and heartburn Taking 800 mg of ibuprofen  regularly (try topical instead)  Encouraged to hold this  Start back on omeprazole if needed  Avoid triggers   Update if not starting to improve in a week or if worsening  Call back and Er precautions noted in detail today

## 2023-10-09 NOTE — Assessment & Plan Note (Addendum)
 Acute on chronic  History of past injuries with wrestling, and past surgery  Exam consistent with rotator cuff etiology  Will try prednisone  taper for discomfort  Xray ordered=some mild deg changes  Encouraged use of ice and voltaren  gel (stop oral nsaid due to worsening gerd ) Also passive rom to prevent frozen shoulder  Plan appointment with sport med

## 2023-10-09 NOTE — Patient Instructions (Addendum)
 Stop the advil  / ibuprofen  Avoid other nsaids   If chest symptoms do continue to improve let us  know   You can use diclofenac  gel over the counter 1% -up to 4 times daily to affected areas   Ice is also helpful/ shoulder or hip also- for 10 minutes at a time    I ordered a shoulder xray Come back after 2 pm for your xray  We will reach out with results   Make an appointment with sports medicine (Dr Watt) on your way out   Take prednisone  as directed for shoulder and hip pain

## 2023-10-15 ENCOUNTER — Encounter: Payer: Self-pay | Admitting: Family Medicine

## 2023-10-15 NOTE — Progress Notes (Deleted)
     Timothy Shirah T. Timothy Fassnacht, MD, CAQ Sports Medicine [facility] [address] [place], [address]  Phone: [phone]  FAX: [phone]  Timothy Holden - 48 y.o. male  MRN [id number]  Date of Birth: 1975/11/13  Date: 10/16/2023  PCP: Wendee Lynwood HERO, NP  Referral: Wendee Lynwood HERO, NP  No chief complaint on file.  Subjective:   Timothy Holden is a 48 y.o. very pleasant male patient with There is no height or weight on file to calculate BMI. who presents with the following:  Patient presents for evaluation and management for acute left shoulder pain.  He does have a history of morbid obesity with recent weight loss as well as a history of HIV.  On chart review, patient has acute on chronic right-sided shoulder pain.  He has had issues with his shoulder for years, going back to a prior history of wrestling, and he also has had a prior shoulder surgery.    Review of Systems is noted in the HPI, as appropriate  Objective:   There were no vitals taken for this visit.  GEN: No acute distress; alert,appropriate. PULM: Breathing comfortably in no respiratory distress PSYCH: Normally interactive.   Laboratory and Imaging Data:  Assessment and Plan:   ***

## 2023-10-16 ENCOUNTER — Ambulatory Visit: Admitting: Family Medicine

## 2023-10-16 DIAGNOSIS — G8929 Other chronic pain: Secondary | ICD-10-CM

## 2023-10-16 DIAGNOSIS — M7061 Trochanteric bursitis, right hip: Secondary | ICD-10-CM

## 2023-10-17 ENCOUNTER — Encounter: Payer: Self-pay | Admitting: Family Medicine

## 2023-10-17 ENCOUNTER — Ambulatory Visit: Admitting: Family Medicine

## 2023-10-17 ENCOUNTER — Ambulatory Visit (INDEPENDENT_AMBULATORY_CARE_PROVIDER_SITE_OTHER)
Admission: RE | Admit: 2023-10-17 | Discharge: 2023-10-17 | Disposition: A | Discharge status: Home / Self Care | Source: Ambulatory Visit | Attending: Family Medicine | Admitting: Family Medicine

## 2023-10-17 VITALS — BP 118/66 | HR 70 | Temp 97.7°F | Ht 70.0 in | Wt 276.4 lb

## 2023-10-17 DIAGNOSIS — Z1211 Encounter for screening for malignant neoplasm of colon: Secondary | ICD-10-CM

## 2023-10-17 DIAGNOSIS — M25551 Pain in right hip: Secondary | ICD-10-CM | POA: Diagnosis not present

## 2023-10-17 DIAGNOSIS — G8929 Other chronic pain: Secondary | ICD-10-CM

## 2023-10-17 DIAGNOSIS — M545 Low back pain, unspecified: Secondary | ICD-10-CM

## 2023-10-17 DIAGNOSIS — M7061 Trochanteric bursitis, right hip: Secondary | ICD-10-CM

## 2023-10-17 DIAGNOSIS — M25511 Pain in right shoulder: Secondary | ICD-10-CM

## 2023-10-17 DIAGNOSIS — M47816 Spondylosis without myelopathy or radiculopathy, lumbar region: Secondary | ICD-10-CM | POA: Diagnosis not present

## 2023-10-17 DIAGNOSIS — M1611 Unilateral primary osteoarthritis, right hip: Secondary | ICD-10-CM | POA: Diagnosis not present

## 2023-10-17 MED ORDER — TRIAMCINOLONE ACETONIDE 40 MG/ML IJ SUSP
40.0000 mg | Freq: Once | INTRAMUSCULAR | Status: AC
Start: 2023-10-17 — End: 2023-10-17
  Administered 2023-10-17: 40 mg via INTRA_ARTICULAR

## 2023-10-17 NOTE — Progress Notes (Addendum)
 Timothy Holden. Timothy Redditt, MD, CAQ Sports Medicine [facility] [address] [place], [address]  Phone: [phone]  FAX: [phone]  Timothy Holden - 48 y.o. male  MRN [id number]  Date of Birth: December 24, 1975  Date: 10/17/2023  PCP: Wendee Lynwood HERO, NP  Referral: Wendee Lynwood HERO, NP  Chief Complaint  Patient presents with   Shoulder Pain    Right   Hip Pain    Bilateral   Subjective:   Timothy Holden is a 48 y.o. very pleasant male patient with Body mass index is 39.66 kg/m. who presents with the following:  Patient presents for evaluation and management for acute left shoulder pain.  He does have a history of morbid obesity with recent weight loss as well as a history of HIV.  On chart review, patient has acute on chronic right-sided shoulder pain.  He has had issues with his shoulder for years, going back to a prior history of wrestling, and he also has had a prior shoulder surgery.  He used to be a Visual merchandiser.  Had shoulder surgery 2-3 years.  Has been a problem for decades Did an incline press a few months ago, bothering off-and-on, sometimes fairly bad.  At this point, the primary issue of his concern is the pain in the back, posterior pelvis and lateral hip.  He cannot think of a specific injury that he has done recently to the hip.  He did have an incident where he hurt the right hip several years ago when he was squatting quite heavy.  At that time, he had some difficulty ambulating and getting around.  He has been doing a lot of hip range of motion work and about 30 minutes of stretching when he is at the gym.  He is avoiding heavy squats and dead lifts for now.  He has some rare numbness in the thighs  2 months of pain in the low back - buttocks Minimal anterior groin pain 1997 to [employer] wrestling History is significant for prior anabolics  Has done some hip ROM work 30 mins of stretching   Prednisone  not really  helping  At this point, the point of maximal pain is at his right lateral hip at the trochanteric bursa.  Review of Systems is noted in the HPI, as appropriate  Objective:   BP 118/66   Pulse 70   Temp 97.7 F (36.5 C) (Temporal)   Ht 5' 10 (1.778 m)   Wt 276 lb 6 oz (125.4 kg)   SpO2 98%   BMI 39.66 kg/m   GEN: No acute distress; alert,appropriate. PULM: Breathing comfortably in no respiratory distress PSYCH: Normally interactive.    Range of motion at  the waist: Flexion: normal Extension: normal Lateral bending: normal Rotation: all normal  No echymosis or edema Rises to examination table with no difficulty Gait: non antalgic  Inspection/Deformity: N Paraspinus Tenderness: Mildly tender to palpation L5-S1  B Ankle Dorsiflexion (L5,4): 5/5 B Great Toe Dorsiflexion (L5,4): 5/5 Heel Walk (L5): WNL Toe Walk (S1): WNL Rise/Squat (L4): WNL  SENSORY B Medial Foot (L4): WNL B Dorsum (L5): WNL B Lateral (S1): WNL Light Touch: WNL Pinprick: WNL  REFLEXES Knee (L4): 2+ Ankle (S1): 2+  B SLR, seated: neg B SLR, supine: neg B FABER: neg B Reverse FABER: neg B Greater Troch: Right-sided trochanteric bursa is markedly tender to palpation B Log Roll: neg B Sciatic Notch: NT    HIP EXAM: SIDE:  R ROM: Abduction, Flexion, Internal and External range of motion: Mild restriction Pain with terminal IROM and EROM: Tenderness with terminal external range of motion GTB: Decidedly tender to palpation SLR: NEG REVERSE FABER: NT, neg Piriformis: NT at direct palpation Str: flexion: 5/5 abduction: 4-/5 adduction: 4+/5 Strength testing non-tender    Laboratory and Imaging Data:  Assessment and Plan:     ICD-10-CM   1. Low back pain of over 3 months duration  M54.50 DG Lumbar Spine Complete    2. Chronic right shoulder pain  M25.511    G89.29     3. Chronic right hip pain  M25.551 DG Hip Unilat W OR W/O Pelvis 2-3 Views Right   G89.29 triamcinolone  acetonide  (KENALOG -40) injection 40 mg    4. Screening for colon cancer  Z12.11 Ambulatory referral to Gastroenterology    5. Trochanteric bursitis of right hip  M70.61      Multifactorial back and hip pain.  The radiological images were independently reviewed by myself in the office and results were reviewed with the patient. My independent interpretation of images:   Radiographically, he has very extensive calcification throughout much of the hip and adjacent to bone at the proximal femur as well as the pelvis on the right.  Would expect prior tendinous injury versus potential bony injury.  None of this would be new, but the radiographs are fairly unremarkable.  Right now, I think his predominant and maximal area of pain is the trochanteric bursa on the right, so we will do an injection there today.  His hip is actually gotten quite a bit weak considering how large and muscular he is.  I gave him some rehab to work on his hip.  Aspiration/Injection Procedure Note Aaronjames Kelsay Jul 22, 1975 Date of procedure: 10/17/2023  Procedure: Large Joint Aspiration / Injection of Hip, Trochanteric Bursa, R Indications: Pain  Procedure Details Verbal consent obtained. Risks, benefits, and alternatives reviewed. Greater trochanter sterilely prepped with Chloraprep. Ethyl Chloride used for anesthesia. 9 cc of Lidocaine 1% injected with 1 mL of Kenalog  40 mg into trochanteric bursa at area of maximal tenderness at greater trochanter. Needle taken to bone to troch bursa, flows easily. Bursa massaged. No bleeding and no complications. Decreased pain after injection. Needle: 22 gauge spinal needle Medication: 1 mL of Kenalog  40 mg   Medication Management during today's office visit: Meds ordered this encounter  Medications   triamcinolone  acetonide (KENALOG -40) injection 40 mg   There are no discontinued medications.  Orders placed today for conditions managed today: Orders Placed This Encounter  Procedures    DG Lumbar Spine Complete   DG Hip Unilat W OR W/O Pelvis 2-3 Views Right   Ambulatory referral to Gastroenterology    Disposition: No follow-ups on file.  [employer] speech-to-text software was used for transcription in this dictation.  Possible transcriptional errors can occur using Animal nutritionist.   Signed,  Jacques DASEN. Keenan Trefry, MD   Outpatient Encounter Medications as of 10/17/2023  Medication Sig   elvitegravir-cobicistat-emtricitabine-tenofovir (GENVOYA) 150-150-200-10 MG TABS tablet Take by mouth.   omeprazole (PRILOSEC) 40 MG capsule TK ONE C PO QAM   predniSONE  (DELTASONE ) 10 MG tablet Take 4 pills once daily by mouth for 3 days, then 3 pills daily for 3 days, then 2 pills daily for 3 days then 1 pill daily for 3 days then stop   Semaglutide -Weight Management (WEGOVY ) 2.4 MG/0.75ML SOAJ Inject 2.4 mg into the skin once a week.   [EXPIRED] triamcinolone  acetonide (  KENALOG -40) injection 40 mg    No facility-administered encounter medications on file as of 10/17/2023.

## 2023-10-18 ENCOUNTER — Ambulatory Visit: Payer: Self-pay | Admitting: Family Medicine

## 2023-12-05 DIAGNOSIS — R7303 Prediabetes: Secondary | ICD-10-CM | POA: Diagnosis not present

## 2023-12-05 DIAGNOSIS — Z21 Asymptomatic human immunodeficiency virus [HIV] infection status: Secondary | ICD-10-CM | POA: Diagnosis not present

## 2023-12-05 DIAGNOSIS — E7800 Pure hypercholesterolemia, unspecified: Secondary | ICD-10-CM | POA: Diagnosis not present

## 2023-12-05 DIAGNOSIS — Z79899 Other long term (current) drug therapy: Secondary | ICD-10-CM | POA: Diagnosis not present

## 2023-12-05 DIAGNOSIS — B20 Human immunodeficiency virus [HIV] disease: Secondary | ICD-10-CM | POA: Diagnosis not present

## 2023-12-14 DIAGNOSIS — M25551 Pain in right hip: Secondary | ICD-10-CM | POA: Diagnosis not present

## 2023-12-31 DIAGNOSIS — M7061 Trochanteric bursitis, right hip: Secondary | ICD-10-CM | POA: Diagnosis not present

## 2023-12-31 DIAGNOSIS — M7631 Iliotibial band syndrome, right leg: Secondary | ICD-10-CM | POA: Diagnosis not present

## 2023-12-31 DIAGNOSIS — M5416 Radiculopathy, lumbar region: Secondary | ICD-10-CM | POA: Diagnosis not present

## 2024-01-06 DIAGNOSIS — M7061 Trochanteric bursitis, right hip: Secondary | ICD-10-CM | POA: Diagnosis not present

## 2024-01-06 DIAGNOSIS — M5416 Radiculopathy, lumbar region: Secondary | ICD-10-CM | POA: Diagnosis not present

## 2024-02-03 DIAGNOSIS — M5416 Radiculopathy, lumbar region: Secondary | ICD-10-CM | POA: Diagnosis not present

## 2024-02-10 DIAGNOSIS — M5416 Radiculopathy, lumbar region: Secondary | ICD-10-CM | POA: Diagnosis not present

## 2024-02-10 DIAGNOSIS — M7631 Iliotibial band syndrome, right leg: Secondary | ICD-10-CM | POA: Diagnosis not present

## 2024-02-11 DIAGNOSIS — M545 Low back pain, unspecified: Secondary | ICD-10-CM | POA: Diagnosis not present

## 2024-02-11 DIAGNOSIS — M5416 Radiculopathy, lumbar region: Secondary | ICD-10-CM | POA: Diagnosis not present

## 2024-02-25 DIAGNOSIS — M545 Low back pain, unspecified: Secondary | ICD-10-CM | POA: Diagnosis not present

## 2024-02-25 DIAGNOSIS — M5416 Radiculopathy, lumbar region: Secondary | ICD-10-CM | POA: Diagnosis not present
